# Patient Record
Sex: Male | Born: 1950 | Race: White | Hispanic: No | Marital: Married
Health system: Southern US, Community
[De-identification: ages and names within clinical notes are randomized; demographics above are authoritative.]

## PROBLEM LIST (undated history)

## (undated) DIAGNOSIS — E559 Vitamin D deficiency, unspecified: Secondary | ICD-10-CM

## (undated) DIAGNOSIS — J986 Disorders of diaphragm: Secondary | ICD-10-CM

## (undated) DIAGNOSIS — N1831 Chronic kidney disease, stage 3a: Secondary | ICD-10-CM

## (undated) DIAGNOSIS — E785 Hyperlipidemia, unspecified: Secondary | ICD-10-CM

## (undated) DIAGNOSIS — M1990 Unspecified osteoarthritis, unspecified site: Secondary | ICD-10-CM

## (undated) DIAGNOSIS — I10 Essential (primary) hypertension: Secondary | ICD-10-CM

## (undated) DIAGNOSIS — D649 Anemia, unspecified: Secondary | ICD-10-CM

## (undated) DIAGNOSIS — K219 Gastro-esophageal reflux disease without esophagitis: Secondary | ICD-10-CM

## (undated) DIAGNOSIS — E119 Type 2 diabetes mellitus without complications: Secondary | ICD-10-CM

## (undated) DIAGNOSIS — L280 Lichen simplex chronicus: Secondary | ICD-10-CM

## (undated) DIAGNOSIS — H3320 Serous retinal detachment, unspecified eye: Secondary | ICD-10-CM

## (undated) DIAGNOSIS — K760 Fatty (change of) liver, not elsewhere classified: Secondary | ICD-10-CM

## (undated) DIAGNOSIS — K635 Polyp of colon: Secondary | ICD-10-CM

## (undated) DIAGNOSIS — E538 Deficiency of other specified B group vitamins: Secondary | ICD-10-CM

## (undated) DIAGNOSIS — G4730 Sleep apnea, unspecified: Secondary | ICD-10-CM

## (undated) HISTORY — PX: COLONOSCOPY: SHX174

## (undated) HISTORY — PX: BLEPHAROPLASTY: SUR158

## (undated) HISTORY — PX: ESOPHAGEAL DILATION: SHX303

## (undated) HISTORY — PX: HERNIA REPAIR: SHX51

## (undated) HISTORY — PX: VASECTOMY: SHX75

## (undated) HISTORY — PX: OTHER SURGICAL HISTORY: SHX169

---

## 2020-06-01 ENCOUNTER — Other Ambulatory Visit
Admission: RE | Admit: 2020-06-01 | Discharge: 2020-06-01 | Disposition: A | Discharge status: Home / Self Care | Payer: Medicare Other | Source: Ambulatory Visit | Attending: Pulmonary Disease | Admitting: Pulmonary Disease

## 2020-06-01 ENCOUNTER — Other Ambulatory Visit: Payer: Self-pay | Admitting: Pulmonary Disease

## 2020-06-01 DIAGNOSIS — R0600 Dyspnea, unspecified: Secondary | ICD-10-CM | POA: Diagnosis present

## 2020-06-01 DIAGNOSIS — K7581 Nonalcoholic steatohepatitis (NASH): Secondary | ICD-10-CM

## 2020-06-01 LAB — FIBRIN DERIVATIVES D-DIMER (ARMC ONLY): Fibrin derivatives D-dimer (ARMC): 760.38 ng/mL (FEU) — ABNORMAL HIGH (ref 0.00–499.00)

## 2020-06-02 ENCOUNTER — Other Ambulatory Visit: Payer: Self-pay | Admitting: Pulmonary Disease

## 2020-06-02 DIAGNOSIS — R7989 Other specified abnormal findings of blood chemistry: Secondary | ICD-10-CM

## 2020-06-02 DIAGNOSIS — R0600 Dyspnea, unspecified: Secondary | ICD-10-CM

## 2020-06-06 ENCOUNTER — Ambulatory Visit: Admission: RE | Admit: 2020-06-06 | Payer: Medicare Other | Source: Ambulatory Visit

## 2020-06-07 ENCOUNTER — Ambulatory Visit: Payer: Self-pay

## 2020-06-08 ENCOUNTER — Ambulatory Visit
Admission: RE | Admit: 2020-06-08 | Discharge: 2020-06-08 | Disposition: A | Discharge status: Home / Self Care | Payer: Medicare Other | Source: Ambulatory Visit | Attending: Pulmonary Disease | Admitting: Pulmonary Disease

## 2020-06-08 ENCOUNTER — Other Ambulatory Visit: Payer: Self-pay

## 2020-06-08 DIAGNOSIS — R0600 Dyspnea, unspecified: Secondary | ICD-10-CM

## 2020-06-08 DIAGNOSIS — K7581 Nonalcoholic steatohepatitis (NASH): Secondary | ICD-10-CM | POA: Diagnosis present

## 2020-08-04 ENCOUNTER — Other Ambulatory Visit: Payer: Self-pay | Admitting: Pulmonary Disease

## 2020-08-04 DIAGNOSIS — M542 Cervicalgia: Secondary | ICD-10-CM

## 2020-08-10 ENCOUNTER — Other Ambulatory Visit
Admission: RE | Admit: 2020-08-10 | Discharge: 2020-08-10 | Disposition: A | Discharge status: Home / Self Care | Payer: Medicare Other | Source: Home / Self Care | Attending: Pulmonary Disease | Admitting: Pulmonary Disease

## 2020-08-10 ENCOUNTER — Ambulatory Visit
Admission: RE | Admit: 2020-08-10 | Discharge: 2020-08-10 | Disposition: A | Discharge status: Home / Self Care | Payer: Medicare Other | Source: Ambulatory Visit | Attending: Pulmonary Disease | Admitting: Pulmonary Disease

## 2020-08-10 ENCOUNTER — Other Ambulatory Visit: Payer: Self-pay

## 2020-08-10 ENCOUNTER — Encounter (INDEPENDENT_AMBULATORY_CARE_PROVIDER_SITE_OTHER): Payer: Self-pay

## 2020-08-10 DIAGNOSIS — M542 Cervicalgia: Secondary | ICD-10-CM

## 2020-08-10 HISTORY — DX: Type 2 diabetes mellitus without complications: E11.9

## 2020-08-10 HISTORY — DX: Essential (primary) hypertension: I10

## 2020-08-10 LAB — CREATININE, SERUM
Creatinine, Ser: 1.4 mg/dL — ABNORMAL HIGH (ref 0.61–1.24)
GFR, Estimated: 55 mL/min — ABNORMAL LOW (ref >60)

## 2020-08-10 LAB — BUN: BUN: 21 mg/dL (ref 8–23)

## 2020-08-10 MED ORDER — IOHEXOL 300 MG/ML  SOLN
100.0000 mL | Freq: Once | INTRAMUSCULAR | Status: AC | PRN
  Administered 2020-08-10: 75 mL via INTRAVENOUS

## 2020-11-26 IMAGING — CT CT T SPINE W/ CM
1 series · 12 of 14 positions shown, 15 images · IV contrast (omnipaque)
Comparison: None.

CLINICAL DATA: Shortness of breath. Right hemidiaphragm paralysis.

EXAM:
CT THORACIC SPINE WITH CONTRAST
TECHNIQUE: Multidetector CT images of thoracic was performed according to the
standard protocol following intravenous contrast administration.
CONTRAST:  75mL OMNIPAQUE IOHEXOL 300 MG/ML  SOLN

[Series 10: t spine soft with · axial · 0.39mm/px · z∈[-826,-482]mm · 12 of 204 slices shown, 15 images]
[im 16/204  soft-tissue]
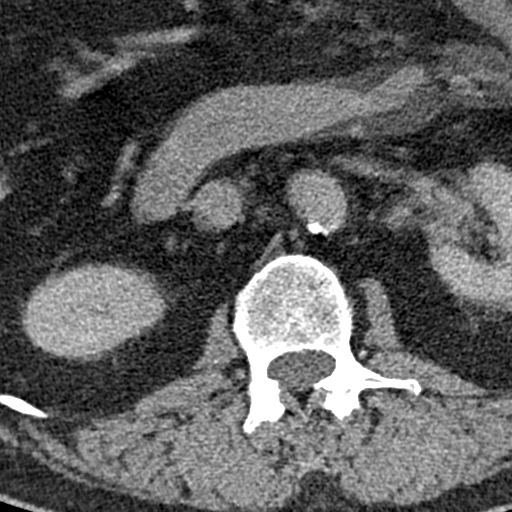
[im 16/204  bone]
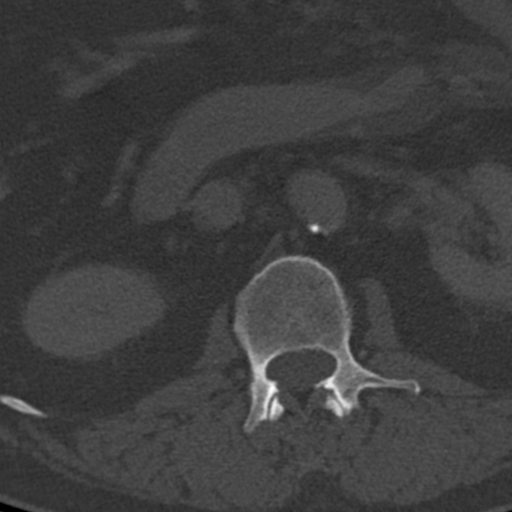
[im 32/204  bone]
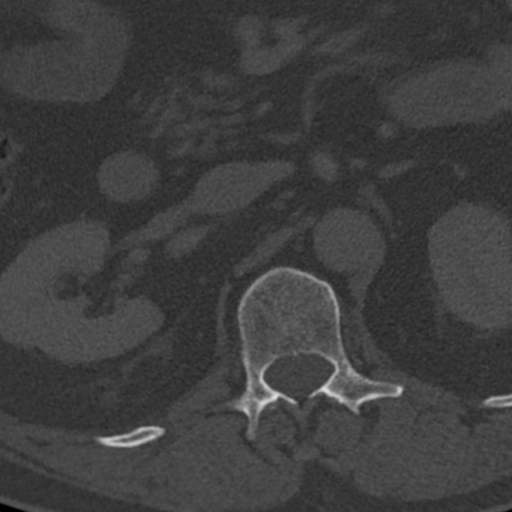
[im 47/204  bone]
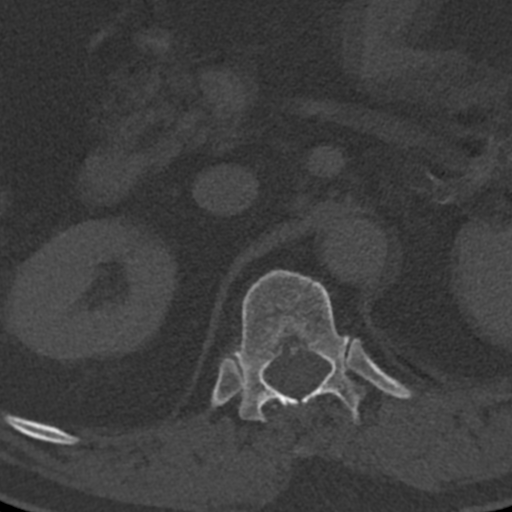
[im 63/204  bone]
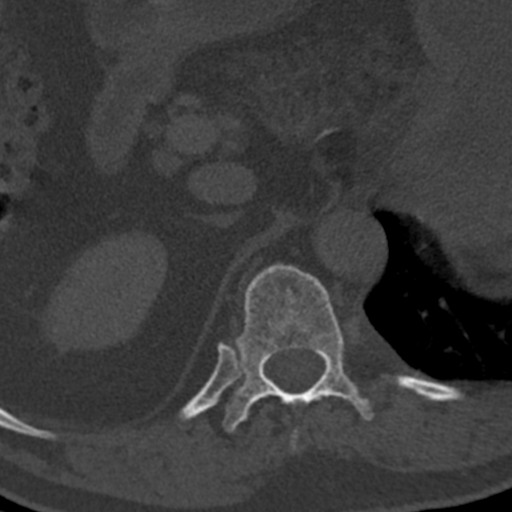
[im 79/204  soft-tissue]
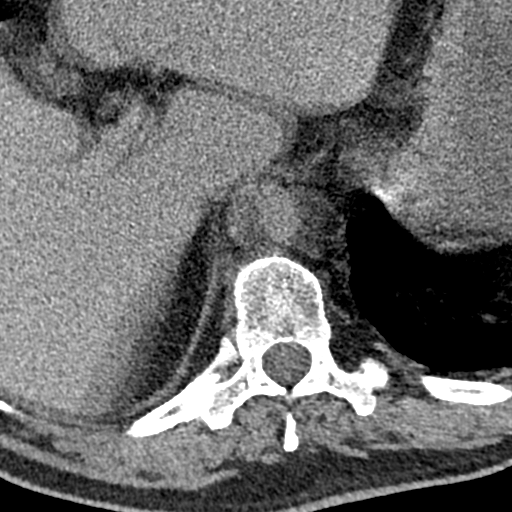
[im 79/204  bone]
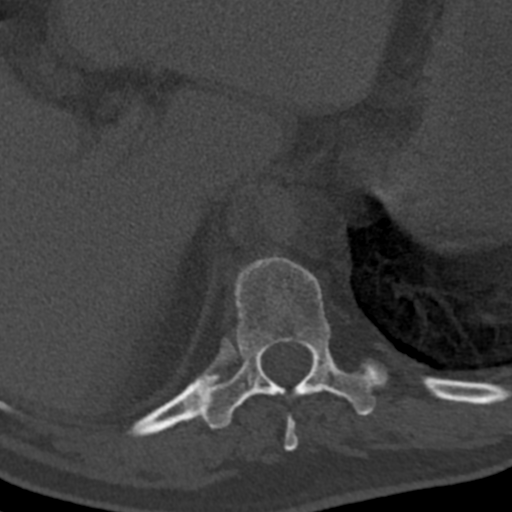
[im 94/204  bone]
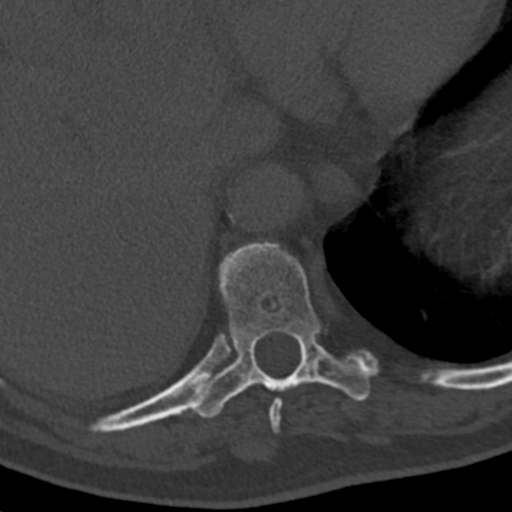
[im 110/204  bone]
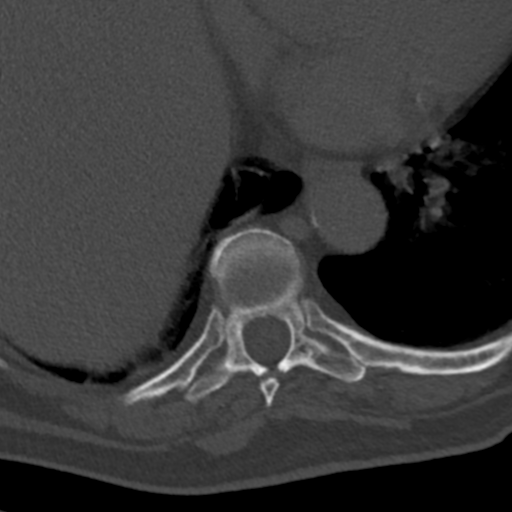
[im 125/204  bone]
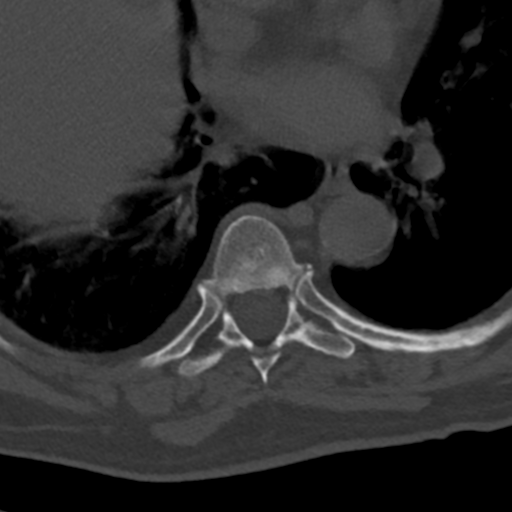
[im 141/204  soft-tissue]
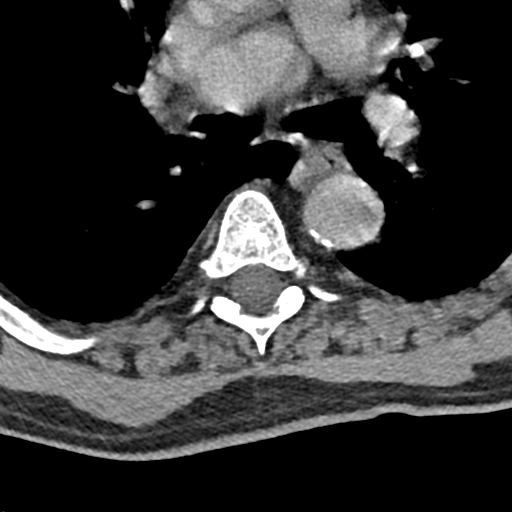
[im 141/204  bone]
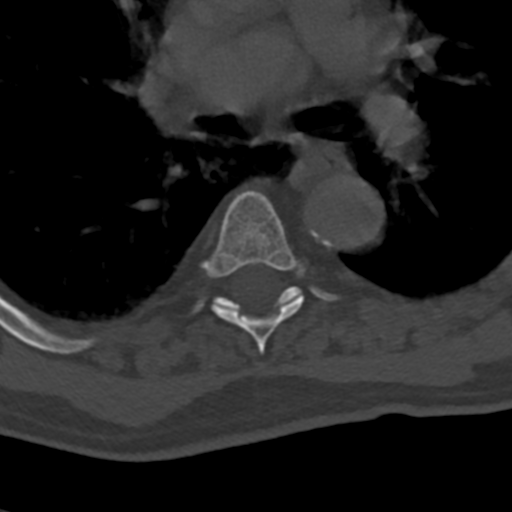
[im 157/204  bone]
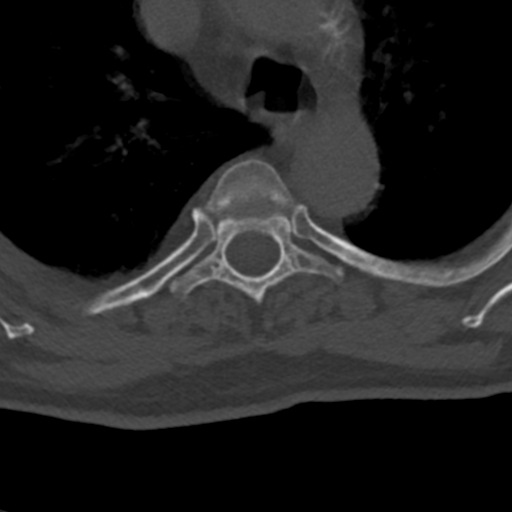
[im 172/204  bone]
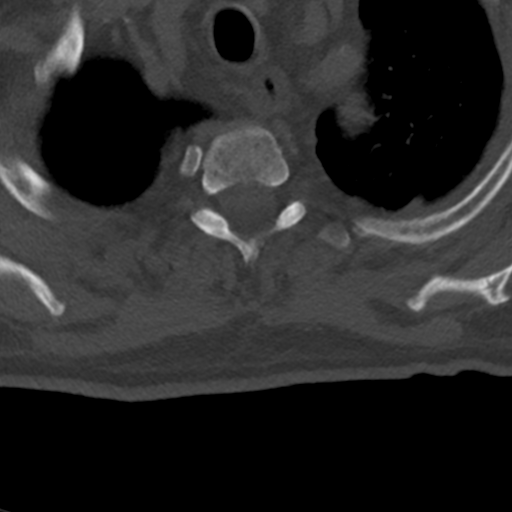
[im 188/204  bone]
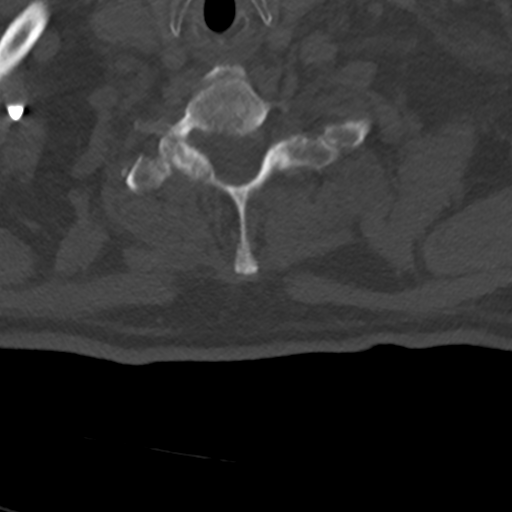

[12 of 14 positions shown; findings below may reference images not displayed]

FINDINGS: Alignment: Normal.

Vertebrae: No fracture or destructive osseous process.

Paraspinal and other soft tissues: Aortic and coronary
atherosclerosis. Right hemidiaphragm elevation with right basilar
atelectasis. No mass identified within the included portions of the
lungs and mediastinum with assessment limited by motion.

Disc levels: No age advanced disc degeneration or evidence of
significant spinal stenosis on this non-myelographic examination.
Advanced facet arthrosis in the lower thoracic spine, particularly
severe on the right at T11-12 with facet spurring resulting in
severe right neural foraminal stenosis.
IMPRESSION: 1. No significant disc degeneration for age. No evidence of
significant spinal stenosis.
2. Advanced facet arthrosis in the lower thoracic spine with severe
right neural foraminal stenosis at T11-12.
3. Aortic Atherosclerosis ([id number]).

## 2020-12-01 ENCOUNTER — Other Ambulatory Visit: Payer: Self-pay

## 2020-12-01 ENCOUNTER — Other Ambulatory Visit
Admission: RE | Admit: 2020-12-01 | Discharge: 2020-12-01 | Disposition: A | Discharge status: Home / Self Care | Payer: Medicare Other | Source: Ambulatory Visit | Attending: Gastroenterology | Admitting: Gastroenterology

## 2020-12-01 DIAGNOSIS — Z20822 Contact with and (suspected) exposure to covid-19: Secondary | ICD-10-CM | POA: Insufficient documentation

## 2020-12-01 DIAGNOSIS — Z01812 Encounter for preprocedural laboratory examination: Secondary | ICD-10-CM | POA: Diagnosis present

## 2020-12-01 LAB — SARS CORONAVIRUS 2 (TAT 6-24 HRS): SARS Coronavirus 2: NEGATIVE

## 2020-12-05 ENCOUNTER — Encounter
Admission: RE | Disposition: A | Discharge status: Home / Self Care | Payer: Self-pay | Source: Home / Self Care | Attending: Gastroenterology

## 2020-12-05 ENCOUNTER — Ambulatory Visit: Payer: Medicare Other | Admitting: Certified Registered Nurse Anesthetist

## 2020-12-05 ENCOUNTER — Other Ambulatory Visit: Payer: Self-pay

## 2020-12-05 ENCOUNTER — Ambulatory Visit
Admission: RE | Admit: 2020-12-05 | Discharge: 2020-12-05 | Disposition: A | Discharge status: Home / Self Care | Payer: Medicare Other | Attending: Gastroenterology | Admitting: Gastroenterology

## 2020-12-05 DIAGNOSIS — Z1211 Encounter for screening for malignant neoplasm of colon: Secondary | ICD-10-CM | POA: Diagnosis not present

## 2020-12-05 DIAGNOSIS — Z79899 Other long term (current) drug therapy: Secondary | ICD-10-CM | POA: Insufficient documentation

## 2020-12-05 DIAGNOSIS — Z7982 Long term (current) use of aspirin: Secondary | ICD-10-CM | POA: Diagnosis not present

## 2020-12-05 DIAGNOSIS — K640 First degree hemorrhoids: Secondary | ICD-10-CM | POA: Diagnosis not present

## 2020-12-05 DIAGNOSIS — Z7984 Long term (current) use of oral hypoglycemic drugs: Secondary | ICD-10-CM | POA: Insufficient documentation

## 2020-12-05 DIAGNOSIS — D123 Benign neoplasm of transverse colon: Secondary | ICD-10-CM | POA: Diagnosis not present

## 2020-12-05 HISTORY — DX: Lichen simplex chronicus: L28.0

## 2020-12-05 HISTORY — DX: Hyperlipidemia, unspecified: E78.5

## 2020-12-05 HISTORY — DX: Polyp of colon: K63.5

## 2020-12-05 HISTORY — DX: Anemia, unspecified: D64.9

## 2020-12-05 HISTORY — DX: Deficiency of other specified B group vitamins: E53.8

## 2020-12-05 HISTORY — DX: Disorders of diaphragm: J98.6

## 2020-12-05 HISTORY — DX: Chronic kidney disease, stage 3a: N18.31

## 2020-12-05 HISTORY — DX: Sleep apnea, unspecified: G47.30

## 2020-12-05 HISTORY — DX: Fatty (change of) liver, not elsewhere classified: K76.0

## 2020-12-05 HISTORY — DX: Serous retinal detachment, unspecified eye: H33.20

## 2020-12-05 HISTORY — DX: Hypomagnesemia: E83.42

## 2020-12-05 HISTORY — DX: Gastro-esophageal reflux disease without esophagitis: K21.9

## 2020-12-05 HISTORY — DX: Unspecified osteoarthritis, unspecified site: M19.90

## 2020-12-05 HISTORY — PX: COLONOSCOPY: SHX5424

## 2020-12-05 HISTORY — DX: Vitamin D deficiency, unspecified: E55.9

## 2020-12-05 LAB — GLUCOSE, CAPILLARY: Glucose-Capillary: 142 mg/dL — ABNORMAL HIGH (ref 70–99)

## 2020-12-05 SURGERY — COLONOSCOPY
Anesthesia: General

## 2020-12-05 MED ORDER — LIDOCAINE HCL (PF) 1 % IJ SOLN
INTRAMUSCULAR | Status: AC
  Filled 2020-12-05: qty 2

## 2020-12-05 MED ORDER — SODIUM CHLORIDE 0.9 % IV SOLN
INTRAVENOUS | Status: DC
  Administered 2020-12-05: 1000 mL via INTRAVENOUS

## 2020-12-05 MED ORDER — PROPOFOL 10 MG/ML IV BOLUS
INTRAVENOUS | Status: DC | PRN
  Administered 2020-12-05: 80 mg via INTRAVENOUS
  Administered 2020-12-05: 20 mg via INTRAVENOUS

## 2020-12-05 MED ORDER — PROPOFOL 500 MG/50ML IV EMUL
INTRAVENOUS | Status: DC | PRN
  Administered 2020-12-05: 160 ug/kg/min via INTRAVENOUS

## 2020-12-05 NOTE — Interval H&P Note (Signed)
History and Physical Interval Note:  12/05/2020 11:34 AM  Kenneth Carr  has presented today for surgery, with the diagnosis of HX.OF COLON POLYPS.  The various methods of treatment have been discussed with the patient and family. After consideration of risks, benefits and other options for treatment, the patient has consented to  Procedure(s) with comments: COLONOSCOPY (N/A) - DM as a surgical intervention.  The patient's history has been reviewed, patient examined, no change in status, stable for surgery.  I have reviewed the patient's chart and labs.  Questions were answered to the patient's satisfaction.     Lesly Rubenstein  Ok to proceed with colonoscopy

## 2020-12-05 NOTE — Op Note (Signed)
[facility] Gastroenterology Patient Name: Kenneth Carr Procedure Date: 12/05/2020 11:30 AM MRN: [id number] Account #: [id number] Date of Birth: 30-Apr-1951 Admit Type: Outpatient Age: 70 Room: [facility] ENDO ROOM 3 Gender: Male Note Status: Finalized Procedure:             Colonoscopy Indications:           High risk colon cancer surveillance: Personal history                         of colonic polyps Providers:             Andrey Farmer MD, MD Referring MD:          Christena Flake. Raechel Ache, MD (Referring MD) Medicines:             Monitored Anesthesia Care Complications:         No immediate complications. Estimated blood loss:                         Minimal. Procedure:             Pre-Anesthesia Assessment:                        - Prior to the procedure, a History and Physical was                         performed, and patient medications and allergies were                         reviewed. The patient is competent. The risks and                         benefits of the procedure and the sedation options and                         risks were discussed with the patient. All questions                         were answered and informed consent was obtained.                         Patient identification and proposed procedure were                         verified by the physician, the nurse, the anesthetist                         and the technician in the endoscopy suite. Mental                         Status Examination: alert and oriented. Airway                         Examination: normal oropharyngeal airway and neck                         mobility. Respiratory Examination: clear to  auscultation. CV Examination: normal. Prophylactic                         Antibiotics: The patient does not require prophylactic                         antibiotics. Prior Anticoagulants: The patient has                         taken no previous anticoagulant or  antiplatelet                         agents. ASA Grade Assessment: II - A patient with mild                         systemic disease. After reviewing the risks and                         benefits, the patient was deemed in satisfactory                         condition to undergo the procedure. The anesthesia                         plan was to use monitored anesthesia care (MAC).                         Immediately prior to administration of medications,                         the patient was re-assessed for adequacy to receive                         sedatives. The heart rate, respiratory rate, oxygen                         saturations, blood pressure, adequacy of pulmonary                         ventilation, and response to care were monitored                         throughout the procedure. The physical status of the                         patient was re-assessed after the procedure.                        After obtaining informed consent, the colonoscope was                         passed under direct vision. Throughout the procedure,                         the patient's blood pressure, pulse, and oxygen                         saturations were monitored continuously. The  Colonoscope was introduced through the anus and                         advanced to the the terminal ileum. The colonoscopy                         was performed without difficulty. The patient                         tolerated the procedure well. The quality of the bowel                         preparation was good. Findings:      The perianal and digital rectal examinations were normal.      The terminal ileum appeared normal.      Two sessile polyps were found in the hepatic flexure. The polyps were 2       to 3 mm in size. These polyps were removed with a cold snare. Resection       and retrieval were complete. Estimated blood loss was minimal.      Two sessile polyps were found in  the transverse colon. The polyps were 3       to 4 mm in size. These polyps were removed with a cold snare. Resection       and retrieval were complete. Estimated blood loss was minimal.      Internal hemorrhoids were found during retroflexion. The hemorrhoids       were Grade I (internal hemorrhoids that do not prolapse).      The exam was otherwise without abnormality on direct and retroflexion       views. Impression:            - The examined portion of the ileum was normal.                        - Two 2 to 3 mm polyps at the hepatic flexure, removed                         with a cold snare. Resected and retrieved.                        - Two 3 to 4 mm polyps in the transverse colon,                         removed with a cold snare. Resected and retrieved.                        - Internal hemorrhoids.                        - The examination was otherwise normal on direct and                         retroflexion views. Recommendation:        - Discharge patient to home.                        - Resume previous diet.                        -  Continue present medications.                        - Await pathology results.                        - Repeat colonoscopy for surveillance based on                         pathology results.                        - Return to referring physician as previously                         scheduled. Procedure Code(s):     --- Professional ---                        [phone], Colonoscopy, flexible; with removal of                         tumor(s), polyp(s), or other lesion(s) by snare                         technique Diagnosis Code(s):     --- Professional ---                        Z86.010, Personal history of colonic polyps                        K64.0, First degree hemorrhoids                        K63.5, Polyp of colon CPT copyright 2019 [employer]. All rights reserved. The codes documented in this report are preliminary  and upon coder review may  be revised to meet current compliance requirements. Andrey Farmer MD, MD 12/05/2020 12:06:49 PM Number of Addenda: 0 Note Initiated On: 12/05/2020 11:30 AM Scope Withdrawal Time: 0 hours 10 minutes 24 seconds  Total Procedure Duration: 0 hours 17 minutes 55 seconds  Estimated Blood Loss:  Estimated blood loss was minimal.      [facility]

## 2020-12-05 NOTE — Anesthesia Postprocedure Evaluation (Signed)
Anesthesia Post Note  Patient: Kenneth Carr  Procedure(s) Performed: COLONOSCOPY (N/A )  Patient location during evaluation: Endoscopy Anesthesia Type: General Level of consciousness: awake and alert Pain management: pain level controlled Vital Signs Assessment: post-procedure vital signs reviewed and stable Respiratory status: spontaneous breathing, nonlabored ventilation, respiratory function stable and patient connected to nasal cannula oxygen Cardiovascular status: blood pressure returned to baseline and stable Postop Assessment: no apparent nausea or vomiting Anesthetic complications: no   No complications documented.   Last Vitals:  Vitals:   12/05/20 1215 12/05/20 1225  BP: 138/78 (!) 142/92  Pulse: 72   Resp: 20 20  Temp:    SpO2:      Last Pain:  Vitals:   12/05/20 1225  TempSrc:   PainSc: 0-No pain                 Arita Miss

## 2020-12-05 NOTE — Anesthesia Preprocedure Evaluation (Signed)
Anesthesia Evaluation  Patient identified by MRN, date of birth, ID band Patient awake    Reviewed: Allergy & Precautions, NPO status , Patient's Chart, lab work & pertinent test results  History of Anesthesia Complications Negative for: history of anesthetic complications  Airway Mallampati: II  TM Distance: >3 FB Neck ROM: Full    Dental no notable dental hx. (+) Teeth Intact   Pulmonary sleep apnea and Continuous Positive Airway Pressure Ventilation , neg COPD, Patient abstained from smoking.Not current smoker,  Paralyzed right hemidiaphragm of unknown origin   Pulmonary exam normal breath sounds clear to auscultation       Cardiovascular Exercise Tolerance: Good METShypertension, (-) CAD and (-) Past MI (-) dysrhythmias  Rhythm:Regular Rate:Normal - Systolic murmurs    Neuro/Psych negative neurological ROS  negative psych ROS   GI/Hepatic GERD  ,(+)     (-) substance abuse  ,   Endo/Other  diabetes  Renal/GU CRFRenal disease     Musculoskeletal   Abdominal   Peds  Hematology   Anesthesia Other Findings Past Medical History: No date: Anemia No date: Arthritis No date: B12 deficiency No date: Chronic kidney disease (CKD) stage G3a/A1, moderately  decreased glomerular filtration rate (GFR) between 45-59 mL/min/1.73  square meter and albuminuria creatinine ratio less than 30 mg/g (HCC) No date: Colon polyps No date: Diabetes mellitus without complication (HCC) No date: GERD (gastroesophageal reflux disease) No date: Hepatic steatosis No date: Hyperlipidemia No date: Hypertension No date: Hypomagnesemia No date: Lichen No date: Paralyzed hemidiaphragm No date: Retinal detachment No date: Sleep apnea No date: Vitamin D deficiency  Reproductive/Obstetrics                             Anesthesia Physical Anesthesia Plan  ASA: II  Anesthesia Plan: General   Post-op Pain  Management:    Induction: Intravenous  PONV Risk Score and Plan: 2 and Ondansetron, Propofol infusion and TIVA  Airway Management Planned: Nasal Cannula  Additional Equipment: None  Intra-op Plan:   Post-operative Plan:   Informed Consent: I have reviewed the patients History and Physical, chart, labs and discussed the procedure including the risks, benefits and alternatives for the proposed anesthesia with the patient or authorized representative who has indicated his/her understanding and acceptance.     Dental advisory given  Plan Discussed with: CRNA and Surgeon  Anesthesia Plan Comments: (Discussed risks of anesthesia with patient, including possibility of difficulty with spontaneous ventilation under anesthesia necessitating airway intervention, PONV, and rare risks such as cardiac or respiratory or neurological events. Patient understands.)        Anesthesia Quick Evaluation

## 2020-12-05 NOTE — H&P (Signed)
Outpatient short stay form Pre-procedure 12/05/2020 11:32 AM Raylene Miyamoto MD, MPH  Primary Physician: Dr. Raechel Ache  Reason for visit:  Surveillance  History of present illness:   70 y/o gentleman with history of polyps here for surveillance colonoscopy. No family history of GI malignancies. No significant abdominal surgeries and no blood thinners.    Current Facility-Administered Medications:  .  0.9 %  sodium chloride infusion, , Intravenous, Continuous, [place], Lysbeth Galas T, MD .  lidocaine (PF) (XYLOCAINE) 1 % injection, , , ,   Medications Prior to Admission  Medication Sig Dispense Refill Last Dose  . acetaminophen (TYLENOL) 500 MG tablet Take 1,000 mg by mouth every 8 (eight) hours as needed.   12/04/2020 at Unknown time  . amLODipine (NORVASC) 5 MG tablet Take 5 mg by mouth daily.   12/04/2020 at Unknown time  . aspirin EC 81 MG tablet Take 81 mg by mouth daily. Swallow whole.   Past Week at Unknown time  . atorvastatin (LIPITOR) 40 MG tablet Take 40 mg by mouth daily.   12/04/2020 at Unknown time  . carvedilol (COREG) 12.5 MG tablet Take 12.5 mg by mouth 2 (two) times daily with a meal.   12/05/2020 at 0603  . clobetasol ointment (TEMOVATE) 4.25 % Apply 1 application topically daily.   Past Week at Unknown time  . diphenhydramine-acetaminophen (TYLENOL PM) 25-500 MG TABS tablet Take 1 tablet by mouth at bedtime as needed.   Past Week at Unknown time  . ferrous sulfate 325 (65 FE) MG EC tablet Take 325 mg by mouth daily with breakfast.   Past Week at Unknown time  . ketorolac (ACULAR) 0.4 % SOLN 1 drop 4 (four) times daily.   Past Week at Unknown time  . losartan (COZAAR) 100 MG tablet Take 100 mg by mouth daily.   12/05/2020 at [place]  . Melatonin 10 MG CAPS Take by mouth daily.   Past Week at Unknown time  . metFORMIN (GLUCOPHAGE) 1000 MG tablet Take 1,000 mg by mouth 2 (two) times daily with a meal.   Past Week at Unknown time  . Multiple Vitamin (MULTIVITAMIN) tablet Take 1 tablet by  mouth daily.   Past Week at Unknown time  . omeprazole (PRILOSEC) 20 MG capsule Take 20 mg by mouth daily.   Past Week at Unknown time  . TURMERIC PO Take 2,000 mg by mouth daily.   Past Week at Unknown time  . vitamin B-12 (CYANOCOBALAMIN) 1000 MCG tablet Take 1,000 mcg by mouth daily.   Past Week at Unknown time     Allergies  Allergen Reactions  . Amoxicillin      Past Medical History:  Diagnosis Date  . Anemia   . Arthritis   . B12 deficiency   . Chronic kidney disease (CKD) stage G3a/A1, moderately decreased glomerular filtration rate (GFR) between 45-59 mL/min/1.73 square meter and albuminuria creatinine ratio less than 30 mg/g (HCC)   . Colon polyps   . Diabetes mellitus without complication ([place])   . GERD (gastroesophageal reflux disease)   . Hepatic steatosis   . Hyperlipidemia   . Hypertension   . Hypomagnesemia   . Lichen   . Paralyzed hemidiaphragm   . Retinal detachment   . Sleep apnea   . Vitamin D deficiency     Review of systems:  Otherwise negative.    Physical Exam  Gen: Alert, oriented. Appears stated age.  HEENT: PERRLA. Lungs: No respiratory distress CV: RRR Abd: soft, benign, no masses Ext: No  edema    Planned procedures: Proceed with colonoscopy. The patient understands the nature of the planned procedure, indications, risks, alternatives and potential complications including but not limited to bleeding, infection, perforation, damage to internal organs and possible oversedation/side effects from anesthesia. The patient agrees and gives consent to proceed.  Please refer to procedure notes for findings, recommendations and patient disposition/instructions.     Raylene Miyamoto MD, MPH Gastroenterology 12/05/2020  11:32 AM

## 2020-12-05 NOTE — Transfer of Care (Signed)
Immediate Anesthesia Transfer of Care Note  Patient: Kenneth Carr  Procedure(s) Performed: COLONOSCOPY (N/A )  Patient Location: PACU  Anesthesia Type:General  Level of Consciousness: drowsy  Airway & Oxygen Therapy: Patient Spontanous Breathing and Patient connected to nasal cannula oxygen  Post-op Assessment: Report given to RN and Post -op Vital signs reviewed and stable  Post vital signs: Reviewed and stable  Last Vitals:  Vitals Value Taken Time  BP 125/64 12/05/20 1207  Temp    Pulse 76 12/05/20 1207  Resp 21 12/05/20 1207  SpO2 96 % 12/05/20 1207  Vitals shown include unvalidated device data.  Last Pain:  Vitals:   12/05/20 1116  TempSrc: Temporal  PainSc: 0-No pain         Complications: No complications documented.

## 2020-12-06 LAB — SURGICAL PATHOLOGY

## 2020-12-08 ENCOUNTER — Encounter: Payer: Self-pay | Admitting: Gastroenterology
